# Patient Record
Sex: Male | Born: 1945 | Race: Black or African American | Hispanic: No | Marital: Single | Smoking: Former smoker
Health system: Southern US, Community
[De-identification: ages and names within clinical notes are randomized; demographics above are authoritative.]

## PROBLEM LIST (undated history)

## (undated) DIAGNOSIS — I10 Essential (primary) hypertension: Secondary | ICD-10-CM

## (undated) DIAGNOSIS — H409 Unspecified glaucoma: Secondary | ICD-10-CM

## (undated) DIAGNOSIS — K649 Unspecified hemorrhoids: Secondary | ICD-10-CM

## (undated) DIAGNOSIS — E7800 Pure hypercholesterolemia, unspecified: Secondary | ICD-10-CM

## (undated) DIAGNOSIS — E119 Type 2 diabetes mellitus without complications: Secondary | ICD-10-CM

## (undated) HISTORY — PX: PROSTATE SURGERY: SHX751

## (undated) HISTORY — PX: THROAT SURGERY: SHX803

---

## 2001-06-09 DIAGNOSIS — C801 Malignant (primary) neoplasm, unspecified: Secondary | ICD-10-CM

## 2001-06-09 HISTORY — DX: Malignant (primary) neoplasm, unspecified: C80.1

## 2006-08-13 ENCOUNTER — Ambulatory Visit: Payer: Self-pay | Admitting: Otolaryngology

## 2006-10-20 ENCOUNTER — Ambulatory Visit: Payer: Self-pay | Admitting: Unknown Physician Specialty

## 2006-10-20 HISTORY — PX: ESOPHAGOGASTRODUODENOSCOPY: SHX1529

## 2006-10-20 HISTORY — PX: COLONOSCOPY: SHX174

## 2009-03-21 ENCOUNTER — Ambulatory Visit: Payer: Self-pay

## 2010-12-29 ENCOUNTER — Emergency Department: Payer: Self-pay | Admitting: Unknown Physician Specialty

## 2012-12-03 ENCOUNTER — Emergency Department: Payer: Self-pay | Admitting: Emergency Medicine

## 2013-09-02 ENCOUNTER — Emergency Department: Payer: Self-pay | Admitting: Emergency Medicine

## 2014-02-19 ENCOUNTER — Emergency Department: Payer: Self-pay | Admitting: Emergency Medicine

## 2014-02-21 ENCOUNTER — Emergency Department: Payer: Self-pay | Admitting: Emergency Medicine

## 2014-02-21 LAB — CBC WITH DIFFERENTIAL/PLATELET
BASOS PCT: 0.5 %
Basophil #: 0 10*3/uL (ref 0.0–0.1)
EOS PCT: 1.7 %
Eosinophil #: 0.2 10*3/uL (ref 0.0–0.7)
HCT: 31.7 % — ABNORMAL LOW (ref 40.0–52.0)
HGB: 10.7 g/dL — AB (ref 13.0–18.0)
LYMPHS ABS: 1.1 10*3/uL (ref 1.0–3.6)
LYMPHS PCT: 12.1 %
MCH: 31.6 pg (ref 26.0–34.0)
MCHC: 33.6 g/dL (ref 32.0–36.0)
MCV: 94 fL (ref 80–100)
Monocyte #: 0.9 x10 3/mm (ref 0.2–1.0)
Monocyte %: 10.5 %
NEUTROS PCT: 75.2 %
Neutrophil #: 6.7 10*3/uL — ABNORMAL HIGH (ref 1.4–6.5)
PLATELETS: 222 10*3/uL (ref 150–440)
RBC: 3.38 10*6/uL — AB (ref 4.40–5.90)
RDW: 13.6 % (ref 11.5–14.5)
WBC: 9 10*3/uL (ref 3.8–10.6)

## 2014-02-21 LAB — COMPREHENSIVE METABOLIC PANEL
AST: 17 U/L (ref 15–37)
Albumin: 3.5 g/dL (ref 3.4–5.0)
Alkaline Phosphatase: 55 U/L
Anion Gap: 10 (ref 7–16)
BUN: 20 mg/dL — ABNORMAL HIGH (ref 7–18)
Bilirubin,Total: 0.3 mg/dL (ref 0.2–1.0)
CHLORIDE: 100 mmol/L (ref 98–107)
Calcium, Total: 10 mg/dL (ref 8.5–10.1)
Co2: 25 mmol/L (ref 21–32)
Creatinine: 1.88 mg/dL — ABNORMAL HIGH (ref 0.60–1.30)
EGFR (African American): 42 — ABNORMAL LOW
GFR CALC NON AF AMER: 36 — AB
GLUCOSE: 150 mg/dL — AB (ref 65–99)
OSMOLALITY: 276 (ref 275–301)
Potassium: 4.1 mmol/L (ref 3.5–5.1)
SGPT (ALT): 13 U/L — ABNORMAL LOW
SODIUM: 135 mmol/L — AB (ref 136–145)
TOTAL PROTEIN: 8.3 g/dL — AB (ref 6.4–8.2)

## 2014-02-26 LAB — CULTURE, BLOOD (SINGLE)

## 2014-10-19 ENCOUNTER — Emergency Department: Payer: Medicare Other

## 2014-10-19 ENCOUNTER — Other Ambulatory Visit: Payer: Self-pay

## 2014-10-19 ENCOUNTER — Emergency Department
Admission: EM | Admit: 2014-10-19 | Discharge: 2014-10-19 | Disposition: A | Discharge status: Home / Self Care | Payer: Medicare Other | Attending: Emergency Medicine | Admitting: Emergency Medicine

## 2014-10-19 ENCOUNTER — Encounter: Payer: Self-pay | Admitting: Medical Oncology

## 2014-10-19 DIAGNOSIS — E119 Type 2 diabetes mellitus without complications: Secondary | ICD-10-CM | POA: Diagnosis not present

## 2014-10-19 DIAGNOSIS — I10 Essential (primary) hypertension: Secondary | ICD-10-CM | POA: Insufficient documentation

## 2014-10-19 DIAGNOSIS — M7911 Myalgia of mastication muscle: Secondary | ICD-10-CM

## 2014-10-19 DIAGNOSIS — R0789 Other chest pain: Secondary | ICD-10-CM | POA: Diagnosis not present

## 2014-10-19 DIAGNOSIS — Z87891 Personal history of nicotine dependence: Secondary | ICD-10-CM | POA: Diagnosis not present

## 2014-10-19 DIAGNOSIS — R079 Chest pain, unspecified: Secondary | ICD-10-CM | POA: Diagnosis present

## 2014-10-19 HISTORY — DX: Essential (primary) hypertension: I10

## 2014-10-19 HISTORY — DX: Pure hypercholesterolemia, unspecified: E78.00

## 2014-10-19 HISTORY — DX: Type 2 diabetes mellitus without complications: E11.9

## 2014-10-19 MED ORDER — NAPROXEN 500 MG PO TABS: 500.0000 mg | ORAL_TABLET | Freq: Two times a day (BID) | ORAL | Status: AC

## 2014-10-19 NOTE — ED Notes (Signed)
Soreness to right nipple x2 weeks , no injury

## 2014-10-19 NOTE — ED Provider Notes (Signed)
[facility] ____________________________  Time seen: Approximately 6:14 PM  I have reviewed the triage vital signs and the nursing notes.   HISTORY  Chief Complaint Chest Pain    HPI Daniel Brady is a 69 y.o. male complained of pain to the right nipple and chest wall for 2 weeks. Patient states the pains only when he's palpate an area. Patient denies any dyspnea. Onset after starting increased physical activity with weight training. Patient rates pain 6/10 UPON PALPATION. PATIENT HAS DENIED ANY DISCHARGE FROM THE NIPPLE SWELLING AROUND THE NIPPLE AREA  Past Medical History  Diagnosis Date  . Diabetes mellitus without complication   . Hypertension   . High cholesterol     There are no active problems to display for this patient.   Past Surgical History  Procedure Laterality Date  . Prostate surgery      No current outpatient prescriptions on file.  Allergies Review of patient's allergies indicates no known allergies.  No family history on file.  Social History History  Substance Use Topics  . Smoking status: Former Research scientist (life sciences)  . Smokeless tobacco: Not on file  . Alcohol Use: No    Review of Systems Constitutional: No fever/chills Eyes: No visual changes. ENT: No sore throat. Cardiovascular: Denies chest pain. Respiratory: Denies shortness of breath. Gastrointestinal: No abdominal pain.  No nausea, no vomiting.  No diarrhea.  No constipation. Genitourinary: Negative for dysuria. Musculoskeletal: Negative for back pain. Mild right chest wall pain. Skin: Negative for rash. Neurological: Negative for headaches, focal weakness or numbness. 10-point ROS otherwise negative.  ____________________________________________   PHYSICAL EXAM:  VITAL SIGNS: ED Triage Vitals  Enc Vitals Group     BP 10/19/14 1735 142/71 mmHg     Pulse Rate 10/19/14 1735 63     Resp 10/19/14 1735 18     Temp 10/19/14 1735 97.4 F (36.3 C)     Temp Source  10/19/14 1735 Oral     SpO2 10/19/14 1735 97 %     Weight 10/19/14 1735 154 lb (69.854 kg)     Height 10/19/14 1735 6\' 1"  (1.854 m)     Head Cir --      Peak Flow --      Pain Score 10/19/14 1736 6     Pain Loc --      Pain Edu? --      Excl. in [place]? --    Constitutional: Alert and oriented. Well appearing and in no acute distress. Eyes: Conjunctivae are normal. PERRL. EOMI. Head: Atraumatic. Nose: No congestion/rhinnorhea. Mouth/Throat: Mucous membranes are moist.  Oropharynx non-erythematous. Neck: No stridor. Neck is supple Hematological/Lymphatic/Immunilogical: No cervical lymphadenopathy. Cardiovascular: Normal rate, regular rhythm. Grossly normal heart sounds.  Good peripheral circulation. Respiratory: Normal respiratory effort.  No retractions. Lungs CTAB. Gastrointestinal: Soft and nontender. No distention. No abdominal bruits. No CVA tenderness. Musculoskeletal: No lower extremity tenderness nor edema.  No joint effusions. Neurologic:  Normal speech and language. No gross focal neurologic deficits are appreciated. Speech is normal. No gait instability. Skin:  Skin is warm, dry and intact. No rash noted. Psychiatric: Mood and affect are normal. Speech and behavior are normal.  ____________________________________________   LABS (all labs ordered are listed, but only abnormal results are displayed)  Labs Reviewed - No data to display ____________________________________________  EKG  Normal sinus Rhythm ____________________________________________  RADIOLOGY   ____________________________________________   PROCEDURES  Procedure(s) performed: None  Critical Care performed: No  ____________________________________________   INITIAL IMPRESSION / ASSESSMENT AND  PLAN / ED COURSE  Pertinent labs & imaging results that were available during my care of the patient were reviewed by me and considered in my medical decision making (see chart for  details).  Myalgia ____________________________________________   FINAL CLINICAL IMPRESSION(S) / ED DIAGNOSES  Final diagnoses:  Masticatory myalgia      Sable Feil, PA-C 10/19/14 [place], PA-C 10/19/14 1831  Lisa Roca, MD 10/19/14 2342

## 2014-10-19 NOTE — Discharge Instructions (Signed)

## 2014-10-19 NOTE — ED Notes (Signed)
Pt ambulatory to triage with reports of rt nipple pain x 2 weeks, only painful to touch. Denies chest pain or sob. NAD noted.

## 2016-04-17 ENCOUNTER — Emergency Department
Admission: EM | Admit: 2016-04-17 | Discharge: 2016-04-17 | Disposition: A | Discharge status: Home / Self Care | Payer: Medicare Other | Attending: Student in an Organized Health Care Education/Training Program | Admitting: Student in an Organized Health Care Education/Training Program

## 2016-04-17 ENCOUNTER — Emergency Department: Payer: Medicare Other

## 2016-04-17 ENCOUNTER — Encounter: Payer: Self-pay | Admitting: Medical Oncology

## 2016-04-17 DIAGNOSIS — E119 Type 2 diabetes mellitus without complications: Secondary | ICD-10-CM | POA: Insufficient documentation

## 2016-04-17 DIAGNOSIS — R51 Headache: Secondary | ICD-10-CM | POA: Insufficient documentation

## 2016-04-17 DIAGNOSIS — Y92410 Unspecified street and highway as the place of occurrence of the external cause: Secondary | ICD-10-CM | POA: Insufficient documentation

## 2016-04-17 DIAGNOSIS — Y999 Unspecified external cause status: Secondary | ICD-10-CM | POA: Insufficient documentation

## 2016-04-17 DIAGNOSIS — Y9389 Activity, other specified: Secondary | ICD-10-CM | POA: Diagnosis not present

## 2016-04-17 DIAGNOSIS — Z87891 Personal history of nicotine dependence: Secondary | ICD-10-CM | POA: Insufficient documentation

## 2016-04-17 DIAGNOSIS — M542 Cervicalgia: Secondary | ICD-10-CM | POA: Diagnosis not present

## 2016-04-17 DIAGNOSIS — I10 Essential (primary) hypertension: Secondary | ICD-10-CM | POA: Insufficient documentation

## 2016-04-17 DIAGNOSIS — Z791 Long term (current) use of non-steroidal anti-inflammatories (NSAID): Secondary | ICD-10-CM | POA: Diagnosis not present

## 2016-04-17 DIAGNOSIS — S0990XA Unspecified injury of head, initial encounter: Secondary | ICD-10-CM | POA: Diagnosis present

## 2016-04-17 MED ORDER — CYCLOBENZAPRINE HCL 5 MG PO TABS: 5.0000 mg | ORAL_TABLET | Freq: Three times a day (TID) | ORAL | 0 refills | Status: AC | PRN
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
## 2019-10-13 ENCOUNTER — Other Ambulatory Visit
Admission: RE | Admit: 2019-10-13 | Discharge: 2019-10-13 | Disposition: A | Discharge status: Home / Self Care | Payer: Medicare Other | Source: Ambulatory Visit | Attending: Internal Medicine | Admitting: Internal Medicine

## 2019-10-13 ENCOUNTER — Other Ambulatory Visit: Payer: Self-pay

## 2019-10-13 DIAGNOSIS — Z20822 Contact with and (suspected) exposure to covid-19: Secondary | ICD-10-CM | POA: Diagnosis not present

## 2019-10-13 DIAGNOSIS — Z01812 Encounter for preprocedural laboratory examination: Secondary | ICD-10-CM | POA: Diagnosis present

## 2019-10-13 LAB — SARS CORONAVIRUS 2 (TAT 6-24 HRS): SARS Coronavirus 2: NEGATIVE

## 2019-10-14 ENCOUNTER — Encounter: Payer: Self-pay | Admitting: Internal Medicine

## 2019-10-17 ENCOUNTER — Encounter: Payer: Self-pay | Admitting: Internal Medicine

## 2019-10-17 ENCOUNTER — Ambulatory Visit: Payer: Medicare Other | Admitting: Anesthesiology

## 2019-10-17 ENCOUNTER — Other Ambulatory Visit: Payer: Self-pay

## 2019-10-17 ENCOUNTER — Encounter
Admission: RE | Disposition: A | Discharge status: Home / Self Care | Payer: Self-pay | Source: Home / Self Care | Attending: Internal Medicine

## 2019-10-17 ENCOUNTER — Ambulatory Visit
Admission: RE | Admit: 2019-10-17 | Discharge: 2019-10-17 | Disposition: A | Discharge status: Home / Self Care | Payer: Medicare Other | Attending: Internal Medicine | Admitting: Internal Medicine

## 2019-10-17 DIAGNOSIS — D123 Benign neoplasm of transverse colon: Secondary | ICD-10-CM | POA: Insufficient documentation

## 2019-10-17 DIAGNOSIS — Z1211 Encounter for screening for malignant neoplasm of colon: Secondary | ICD-10-CM | POA: Diagnosis present

## 2019-10-17 DIAGNOSIS — Z791 Long term (current) use of non-steroidal anti-inflammatories (NSAID): Secondary | ICD-10-CM | POA: Diagnosis not present

## 2019-10-17 DIAGNOSIS — E7800 Pure hypercholesterolemia, unspecified: Secondary | ICD-10-CM | POA: Insufficient documentation

## 2019-10-17 DIAGNOSIS — Z7984 Long term (current) use of oral hypoglycemic drugs: Secondary | ICD-10-CM | POA: Insufficient documentation

## 2019-10-17 DIAGNOSIS — H42 Glaucoma in diseases classified elsewhere: Secondary | ICD-10-CM | POA: Insufficient documentation

## 2019-10-17 DIAGNOSIS — I10 Essential (primary) hypertension: Secondary | ICD-10-CM | POA: Insufficient documentation

## 2019-10-17 DIAGNOSIS — Z79899 Other long term (current) drug therapy: Secondary | ICD-10-CM | POA: Insufficient documentation

## 2019-10-17 DIAGNOSIS — E1139 Type 2 diabetes mellitus with other diabetic ophthalmic complication: Secondary | ICD-10-CM | POA: Insufficient documentation

## 2019-10-17 DIAGNOSIS — Z8546 Personal history of malignant neoplasm of prostate: Secondary | ICD-10-CM | POA: Diagnosis not present

## 2019-10-17 DIAGNOSIS — K640 First degree hemorrhoids: Secondary | ICD-10-CM | POA: Diagnosis not present

## 2019-10-17 DIAGNOSIS — Z7982 Long term (current) use of aspirin: Secondary | ICD-10-CM | POA: Diagnosis not present

## 2019-10-17 HISTORY — DX: Unspecified hemorrhoids: K64.9

## 2019-10-17 HISTORY — DX: Unspecified glaucoma: H40.9

## 2019-10-17 HISTORY — PX: COLONOSCOPY WITH PROPOFOL: SHX5780

## 2019-10-17 SURGERY — COLONOSCOPY WITH PROPOFOL
Anesthesia: General

## 2019-10-17 MED ORDER — PROPOFOL 500 MG/50ML IV EMUL
INTRAVENOUS | Status: DC | PRN
  Administered 2019-10-17: 150 ug/kg/min via INTRAVENOUS

## 2019-10-17 MED ORDER — SODIUM CHLORIDE 0.9 % IV SOLN: INTRAVENOUS | Status: DC

## 2019-10-17 MED ORDER — PROPOFOL 10 MG/ML IV BOLUS
INTRAVENOUS | Status: DC | PRN
  Administered 2019-10-17: 70 mg via INTRAVENOUS

## 2019-10-17 MED ORDER — LIDOCAINE HCL (CARDIAC) PF 100 MG/5ML IV SOSY
PREFILLED_SYRINGE | INTRAVENOUS | Status: DC | PRN
  Administered 2019-10-17: 80 mg via INTRAVENOUS

## 2019-10-17 NOTE — Op Note (Signed)
[facility] Gastroenterology Patient Name: Daniel Brady Procedure Date: 10/17/2019 1:34 PM MRN: [id number] Account #: [id number] Date of Birth: [birth year] Admit Type: Outpatient Age: 74 Room: [facility] ENDO ROOM 3 Gender: Male Note Status: Finalized Procedure:             Colonoscopy Indications:           Screening for colorectal malignant neoplasm Providers:             Benay Pike. Alice Reichert MD, MD Referring MD:          No Local Md, MD (Referring MD) Medicines:             Propofol per Anesthesia Complications:         No immediate complications. Procedure:             Pre-Anesthesia Assessment:                        - The risks and benefits of the procedure and the                         sedation options and risks were discussed with the                         patient. All questions were answered and informed                         consent was obtained.                        - Patient identification and proposed procedure were                         verified prior to the procedure by the nurse. The                         procedure was verified in the procedure room.                        - After reviewing the risks and benefits, the patient                         was deemed in satisfactory condition to undergo the                         procedure.                        - ASA Grade Assessment: III - A patient with severe                         systemic disease.                        - After reviewing the risks and benefits, the patient                         was deemed in satisfactory condition to undergo the                         procedure.  After obtaining informed consent, the colonoscope was                         passed under direct vision. Throughout the procedure,                         the patient's blood pressure, pulse, and oxygen                         saturations were monitored continuously. The        Colonoscope was introduced through the anus and                         advanced to the the cecum, identified by appendiceal                         orifice and ileocecal valve. The colonoscopy was                         performed without difficulty. The patient tolerated                         the procedure well. The quality of the bowel                         preparation was good. The ileocecal valve, appendiceal                         orifice, and rectum were photographed. Findings:      The perianal and digital rectal examinations were normal. Pertinent       negatives include normal sphincter tone and no palpable rectal lesions.      Non-bleeding internal hemorrhoids were found during retroflexion. The       hemorrhoids were Grade I (internal hemorrhoids that do not prolapse).      A 4 mm polyp was found in the transverse colon. The polyp was sessile.       The polyp was removed with a jumbo cold forceps. Resection and retrieval       were complete.      The exam was otherwise without abnormality on direct and retroflexion       views. Impression:            - Non-bleeding internal hemorrhoids.                        - One 4 mm polyp in the transverse colon, removed with                         a jumbo cold forceps. Resected and retrieved.                        - The examination was otherwise normal on direct and                         retroflexion views. Recommendation:        - Patient has a contact number available for  emergencies. The signs and symptoms of potential                         delayed complications were discussed with the patient.                         Return to normal activities tomorrow. Written                         discharge instructions were provided to the patient.                        - Resume previous diet.                        - Continue present medications.                        - If polyps are benign or  adenomatous without                         dysplasia, I will advise NO further colonoscopy due to                         advanced age and/or severe comorbidity.                        - Return to my office PRN.                        - The findings and recommendations were discussed with                         the patient. Procedure Code(s):     --- Professional ---                        [phone], Colonoscopy, flexible; with biopsy, single or                         multiple Diagnosis Code(s):     --- Professional ---                        K64.0, First degree hemorrhoids                        K63.5, Polyp of colon                        Z12.11, Encounter for screening for malignant neoplasm                         of colon CPT copyright 2019 American Medical Association. All rights reserved. The codes documented in this report are preliminary and upon coder review may  be revised to meet current compliance requirements. Efrain Sella MD, MD 10/17/2019 1:56:43 PM This report has been signed electronically. Number of Addenda: 0 Note Initiated On: 10/17/2019 1:34 PM Scope Withdrawal Time: 0 hours 7 minutes 6 seconds  Total Procedure Duration: 0 hours 10 minutes 2 seconds  Estimated Blood Loss:  Estimated blood loss: none.  [facility]

## 2019-10-17 NOTE — Anesthesia Postprocedure Evaluation (Signed)
Anesthesia Post Note  Patient: Daniel Brady  Procedure(s) Performed: COLONOSCOPY WITH PROPOFOL (N/A )  Patient location during evaluation: Endoscopy Anesthesia Type: General Level of consciousness: awake and alert and oriented Pain management: pain level controlled Vital Signs Assessment: post-procedure vital signs reviewed and stable Respiratory status: spontaneous breathing, nonlabored ventilation and respiratory function stable Cardiovascular status: blood pressure returned to baseline and stable Postop Assessment: no signs of nausea or vomiting Anesthetic complications: no     Last Vitals:  Vitals:   10/17/19 1359 10/17/19 1419  BP: 105/63 109/60  Pulse:    Resp: 16   Temp: 36.7 C   SpO2:      Last Pain:  Vitals:   10/17/19 1419  TempSrc:   PainSc: 0-No pain                 Zuria Fosdick

## 2019-10-17 NOTE — Anesthesia Preprocedure Evaluation (Signed)
Anesthesia Evaluation  Patient identified by MRN, date of birth, ID band Patient awake    Reviewed: Allergy & Precautions, NPO status , Patient's Chart, lab work & pertinent test results  History of Anesthesia Complications Negative for: history of anesthetic complications  Airway Mallampati: II  TM Distance: >3 FB Neck ROM: Full    Dental  (+) Poor Dentition, Missing   Pulmonary neg sleep apnea, neg COPD, former smoker,    breath sounds clear to auscultation- rhonchi (-) wheezing      Cardiovascular hypertension, Pt. on medications (-) CAD, (-) Past MI, (-) Cardiac Stents and (-) CABG  Rhythm:Regular Rate:Normal - Systolic murmurs and - Diastolic murmurs    Neuro/Psych neg Seizures negative neurological ROS  negative psych ROS   GI/Hepatic negative GI ROS, Neg liver ROS,   Endo/Other  diabetes, Oral Hypoglycemic Agents  Renal/GU negative Renal ROS     Musculoskeletal negative musculoskeletal ROS (+)   Abdominal (+) - obese,   Peds  Hematology negative hematology ROS (+)   Anesthesia Other Findings Past Medical History: 2003: Cancer ([place])     Comment:  prostate No date: Diabetes mellitus without complication ([facility])     Comment:  type 2 No date: Glaucoma No date: Hemorrhoids No date: High cholesterol No date: Hypertension   Reproductive/Obstetrics                             Anesthesia Physical Anesthesia Plan  ASA: II  Anesthesia Plan: General   Post-op Pain Management:    Induction: Intravenous  PONV Risk Score and Plan: 1 and Propofol infusion  Airway Management Planned: Natural Airway  Additional Equipment:   Intra-op Plan:   Post-operative Plan:   Informed Consent: I have reviewed the patients History and Physical, chart, labs and discussed the procedure including the risks, benefits and alternatives for the proposed anesthesia with the patient or authorized  representative who has indicated his/her understanding and acceptance.     Dental advisory given  Plan Discussed with: CRNA and Anesthesiologist  Anesthesia Plan Comments:         Anesthesia Quick Evaluation

## 2019-10-17 NOTE — Transfer of Care (Signed)
Immediate Anesthesia Transfer of Care Note  Patient: Daniel Brady  Procedure(s) Performed: COLONOSCOPY WITH PROPOFOL (N/A )  Patient Location: Endoscopy Unit  Anesthesia Type:General  Level of Consciousness: drowsy  Airway & Oxygen Therapy: Patient Spontanous Breathing  Post-op Assessment: Report given to RN and Post -op Vital signs reviewed and stable  Post vital signs: Reviewed and stable  Last Vitals:  Vitals Value Taken Time  BP 105/63 10/17/19 1358  Temp    Pulse 82 10/17/19 1359  Resp 14 10/17/19 1359  SpO2 100 % 10/17/19 1359  Vitals shown include unvalidated device data.  Last Pain:  Vitals:   10/17/19 1240  TempSrc: Tympanic  PainSc: 0-No pain         Complications: No apparent anesthesia complications

## 2019-10-17 NOTE — H&P (Signed)
  Outpatient short stay form Pre-procedure 10/17/2019 12:23 PM Aking Klabunde K. Alice Reichert, M.D.  Primary Physician: Thereasa Distance, M.D.  Reason for visit:  Colon cancer screening.  History of present illness:  Patient presents for colonoscopy for colon cancer screening. The patient denies complaints of abdominal pain, significant change in bowel habits, or rectal bleeding.     No current facility-administered medications for this encounter.  Medications Prior to Admission  Medication Sig Dispense Refill Last Dose  . aspirin 81 MG chewable tablet Chew 81 mg by mouth daily.     . brimonidine (ALPHAGAN P) 0.1 % SOLN Place 1 drop into both eyes daily.     Marland Kitchen latanoprost (XALATAN) 0.005 % ophthalmic solution Place 1 drop into both eyes at bedtime.     . pioglitazone (ACTOS) 45 MG tablet Take 45 mg by mouth daily.     . simvastatin (ZOCOR) 20 MG tablet Take 20 mg by mouth daily.     . sitaGLIPtin (JANUVIA) 100 MG tablet Take 100 mg by mouth daily.     Marland Kitchen testosterone cypionate (DEPOTESTOSTERONE CYPIONATE) 200 MG/ML injection Inject 200 mg into the muscle every 7 (seven) days.     . Travoprost, BAK Free, (TRAVATAN) 0.004 % SOLN ophthalmic solution Place 1 drop into both eyes at bedtime.     . cyclobenzaprine (FLEXERIL) 5 MG tablet Take 1 tablet (5 mg total) by mouth 3 (three) times daily as needed for muscle spasms. 30 tablet 0   . naproxen (NAPROSYN) 500 MG tablet Take 1 tablet (500 mg total) by mouth 2 (two) times daily with a meal. 20 tablet 00      No Known Allergies   Past Medical History:  Diagnosis Date  . Cancer [facility]) 2003   prostate  . Diabetes mellitus without complication ([place])    type 2  . Glaucoma   . Hemorrhoids   . High cholesterol   . Hypertension     Review of systems:  Otherwise negative.    Physical Exam  Gen: Alert, oriented. Appears stated age.  HEENT: [place]/AT. PERRLA. Lungs: CTA, no wheezes. CV: RR nl S1, S2. Abd: soft, benign, no masses. BS+ Ext: No edema.  Pulses 2+    Planned procedures: Proceed with colonoscopy. The patient understands the nature of the planned procedure, indications, risks, alternatives and potential complications including but not limited to bleeding, infection, perforation, damage to internal organs and possible oversedation/side effects from anesthesia. The patient agrees and gives consent to proceed.  Please refer to procedure notes for findings, recommendations and patient disposition/instructions.     Benett Swoyer K. Alice Reichert, M.D. Gastroenterology 10/17/2019  12:23 PM

## 2019-10-17 NOTE — Interval H&P Note (Signed)
History and Physical Interval Note:  10/17/2019 12:25 PM  Daniel Brady  has presented today for surgery, with the diagnosis of [place].  The various methods of treatment have been discussed with the patient and family. After consideration of risks, benefits and other options for treatment, the patient has consented to  Procedure(s): COLONOSCOPY WITH PROPOFOL (N/A) as a surgical intervention.  The patient's history has been reviewed, patient examined, no change in status, stable for surgery.  I have reviewed the patient's chart and labs.  Questions were answered to the patient's satisfaction.     [place], [place]

## 2019-10-18 ENCOUNTER — Encounter: Payer: Self-pay | Admitting: *Deleted

## 2019-10-19 LAB — SURGICAL PATHOLOGY

## 2022-08-13 ENCOUNTER — Emergency Department
Admission: EM | Admit: 2022-08-13 | Discharge: 2022-08-13 | Disposition: A | Discharge status: Home / Self Care | Payer: Medicare Other | Attending: Student in an Organized Health Care Education/Training Program | Admitting: Student in an Organized Health Care Education/Training Program

## 2022-08-13 ENCOUNTER — Encounter: Payer: Self-pay | Admitting: Emergency Medicine

## 2022-08-13 DIAGNOSIS — R310 Gross hematuria: Secondary | ICD-10-CM | POA: Insufficient documentation

## 2022-08-13 DIAGNOSIS — R1030 Lower abdominal pain, unspecified: Secondary | ICD-10-CM | POA: Diagnosis not present

## 2022-08-13 DIAGNOSIS — R339 Retention of urine, unspecified: Secondary | ICD-10-CM | POA: Insufficient documentation

## 2022-08-13 DIAGNOSIS — Z8546 Personal history of malignant neoplasm of prostate: Secondary | ICD-10-CM | POA: Insufficient documentation

## 2022-08-13 DIAGNOSIS — R338 Other retention of urine: Secondary | ICD-10-CM

## 2022-08-13 LAB — BASIC METABOLIC PANEL
Anion gap: 7 (ref 5–15)
BUN: 26 mg/dL — ABNORMAL HIGH (ref 8–23)
CO2: 23 mmol/L (ref 22–32)
Calcium: 9.7 mg/dL (ref 8.9–10.3)
Chloride: 103 mmol/L (ref 98–111)
Creatinine, Ser: 1.7 mg/dL — ABNORMAL HIGH (ref 0.61–1.24)
GFR, Estimated: 41 mL/min — ABNORMAL LOW (ref >60)
Glucose, Bld: 157 mg/dL — ABNORMAL HIGH (ref 70–99)
Potassium: 4 mmol/L (ref 3.5–5.1)
Sodium: 133 mmol/L — ABNORMAL LOW (ref 135–145)

## 2022-08-13 LAB — CBC WITH DIFFERENTIAL/PLATELET
Abs Immature Granulocytes: 0.01 10*3/uL (ref 0.00–0.07)
Basophils Absolute: 0 10*3/uL (ref 0.0–0.1)
Basophils Relative: 1 %
Eosinophils Absolute: 0.1 10*3/uL (ref 0.0–0.5)
Eosinophils Relative: 1 %
HCT: 32.6 % — ABNORMAL LOW (ref 39.0–52.0)
Hemoglobin: 10.6 g/dL — ABNORMAL LOW (ref 13.0–17.0)
Immature Granulocytes: 0 %
Lymphocytes Relative: 13 %
Lymphs Abs: 0.8 10*3/uL (ref 0.7–4.0)
MCH: 30.5 pg (ref 26.0–34.0)
MCHC: 32.5 g/dL (ref 30.0–36.0)
MCV: 93.7 fL (ref 80.0–100.0)
Monocytes Absolute: 0.3 10*3/uL (ref 0.1–1.0)
Monocytes Relative: 5 %
Neutro Abs: 4.8 10*3/uL (ref 1.7–7.7)
Neutrophils Relative %: 80 %
Platelets: 190 10*3/uL (ref 150–400)
RBC: 3.48 MIL/uL — ABNORMAL LOW (ref 4.22–5.81)
RDW: 14.2 % (ref 11.5–15.5)
WBC: 5.9 10*3/uL (ref 4.0–10.5)
nRBC: 0 % (ref 0.0–0.2)

## 2022-08-13 LAB — URINALYSIS, ROUTINE W REFLEX MICROSCOPIC
Bacteria, UA: NONE SEEN
RBC / HPF: >50 RBC/hpf (ref 0–5)
Specific Gravity, Urine: 1.018 (ref 1.005–1.030)
Squamous Epithelial / HPF: NONE SEEN /HPF (ref 0–5)
WBC, UA: NONE SEEN WBC/hpf (ref 0–5)

## 2022-08-13 MED ORDER — LIDOCAINE HCL URETHRAL/MUCOSAL 2 % EX GEL
1.0000 | Freq: Once | CUTANEOUS | Status: AC
  Administered 2022-08-13: 1 via URETHRAL
  Filled 2022-08-13: qty 10

## 2022-08-13 MED ORDER — CEPHALEXIN 500 MG PO CAPS: 500.0000 mg | ORAL_CAPSULE | Freq: Two times a day (BID) | ORAL | 0 refills | Status: AC

## 2022-08-13 NOTE — ED Provider Notes (Signed)
[facility] Provider Note    Event Date/Time   First MD Initiated Contact with Patient 08/13/22 [phone]     (approximate)   History   No chief complaint on file.   HPI  Ayven Cahan is a 77 y.o. male history of prostate cancer presents to the ER for evaluation of urinary retention and severe suprapubic pain.  States that noted in the middle the night he got up with the urge to pee the past blood.  Yesterday was not having any urinary symptoms.  Not any blood thinners.     Physical Exam   Triage Vital Signs: ED Triage Vitals  Enc Vitals Group     BP 08/13/22 0652 (!) 199/111     Pulse Rate 08/13/22 0617 (!) 110     Resp 08/13/22 0617 (!) 21     Temp 08/13/22 0617 98.3 F (36.8 C)     Temp Source 08/13/22 0617 Oral     SpO2 08/13/22 0617 100 %     Weight 08/13/22 0618 143 lb (64.9 kg)     Height 08/13/22 0618 '6\' 1"'$  (1.854 m)     Head Circumference --      Peak Flow --      Pain Score 08/13/22 0619 8     Pain Loc --      Pain Edu? --      Excl. in [place]? --     Most recent vital signs: Vitals:   08/13/22 0652 08/13/22 0753  BP: (!) 199/111 (!) 172/95  Pulse:  88  Resp:    Temp:    SpO2:  100%     Constitutional: Alert uncomfortable appearing, standing beside bed Eyes: Conjunctivae are normal.  Head: Atraumatic. Nose: No congestion/rhinnorhea. Mouth/Throat: Mucous membranes are moist.   Neck: Painless ROM.  Cardiovascular:   Good peripheral circulation. Respiratory: Normal respiratory effort.  No retractions.  Gastrointestinal: Soft and nontender.  Musculoskeletal:  no deformity Neurologic:  MAE spontaneously. No gross focal neurologic deficits are appreciated.  Skin:  Skin is warm, dry and intact. No rash noted. Psychiatric: Mood and affect are normal. Speech and behavior are normal.    ED Results / Procedures / Treatments   Labs (all labs ordered are listed, but only abnormal results are displayed) Labs Reviewed  CBC WITH  DIFFERENTIAL/PLATELET - Abnormal; Notable for the following components:      Result Value   RBC 3.48 (*)    Hemoglobin 10.6 (*)    HCT 32.6 (*)    All other components within normal limits  BASIC METABOLIC PANEL - Abnormal; Notable for the following components:   Sodium 133 (*)    Glucose, Bld 157 (*)    BUN 26 (*)    Creatinine, Ser 1.70 (*)    GFR, Estimated 41 (*)    All other components within normal limits  URINALYSIS, ROUTINE W REFLEX MICROSCOPIC - Abnormal; Notable for the following components:   Color, Urine RED (*)    APPearance TURBID (*)    Glucose, UA   (*)    Value: TEST NOT REPORTED DUE TO COLOR INTERFERENCE OF URINE PIGMENT   Hgb urine dipstick   (*)    Value: TEST NOT REPORTED DUE TO COLOR INTERFERENCE OF URINE PIGMENT   Bilirubin Urine   (*)    Value: TEST NOT REPORTED DUE TO COLOR INTERFERENCE OF URINE PIGMENT   Ketones, ur   (*)    Value: TEST NOT REPORTED DUE TO COLOR INTERFERENCE  OF URINE PIGMENT   Protein, ur   (*)    Value: TEST NOT REPORTED DUE TO COLOR INTERFERENCE OF URINE PIGMENT   Nitrite   (*)    Value: TEST NOT REPORTED DUE TO COLOR INTERFERENCE OF URINE PIGMENT   Leukocytes,Ua   (*)    Value: TEST NOT REPORTED DUE TO COLOR INTERFERENCE OF URINE PIGMENT   All other components within normal limits     EKG     RADIOLOGY    PROCEDURES:  Critical Care performed:   Procedures   MEDICATIONS ORDERED IN ED: Medications  lidocaine (XYLOCAINE) 2 % jelly 1 Application (1 Application Urethral Given 08/13/22 0738)     IMPRESSION / MDM / ASSESSMENT AND PLAN / ED COURSE  I reviewed the triage vital signs and the nursing notes.                              Differential diagnosis includes, but is not limited to, urinary retention, bladder outlet obstruction, prostatitis, cystitis, mass  Patient presenting to the ER for evaluation of symptoms as described above.  Based on symptoms, risk factors and considered above differential, this presenting  complaint could reflect a potentially life-threatening illness therefore the patient will be placed on continuous pulse oximetry and telemetry for monitoring.  Laboratory evaluation will be sent to evaluate for the above complaints.  Will plan Foley catheter insertion for bladder decompression as he does have evidence of acute retention.   Clinical Course as of 08/13/22 0927  Wed Aug 13, 2022  0827 Patient with significant improvement after insertion of Foley catheter.  Initially [place] colored urine with manual irrigation was able to remove some clot now it is starting to clear will observe to make sure there is no recurrent retention. [PR]  0925 Bladder irrigated again.  Now with mostly clear red-tinged urine.  No residual clots after irrigation.  Will place on antibiotic due to concern for possible prostatitis.  Patient has follow-up with urology.  Discussed supportive care and signs and symptoms which she should return to the ER.  Patient agreeable to plan. [PR]    Clinical Course User Index [PR] Merlyn Lot, MD    FINAL CLINICAL IMPRESSION(S) / ED DIAGNOSES   Final diagnoses:  Gross hematuria  Acute urinary retention     Rx / DC Orders   ED Discharge Orders          Ordered    cephALEXin (KEFLEX) 500 MG capsule  2 times daily        08/13/22 [id number]             Note:  This document was prepared using Dragon voice recognition software and may include unintentional dictation errors.    Merlyn Lot, MD 08/13/22 [phone]

## 2022-08-13 NOTE — ED Triage Notes (Signed)
Pt with hx/p Prostate cancer as well as Prostate surgery in 2020. Pt to ED this AM due to hematuria as well as urinary retention since this AM.

## 2022-08-14 ENCOUNTER — Emergency Department
Admission: EM | Admit: 2022-08-14 | Discharge: 2022-08-14 | Disposition: A | Discharge status: Home / Self Care | Payer: Medicare Other | Attending: Emergency Medicine | Admitting: Emergency Medicine

## 2022-08-14 ENCOUNTER — Other Ambulatory Visit: Payer: Self-pay

## 2022-08-14 DIAGNOSIS — T839XXA Unspecified complication of genitourinary prosthetic device, implant and graft, initial encounter: Secondary | ICD-10-CM

## 2022-08-14 DIAGNOSIS — R339 Retention of urine, unspecified: Secondary | ICD-10-CM | POA: Diagnosis present

## 2022-08-14 NOTE — Discharge Instructions (Signed)
Follow-up with the urologist you were previously instructed to follow-up with.  Return emergency department if further problems with the catheter.

## 2022-08-14 NOTE — ED Notes (Signed)
Bladder scan volume 541 ml.

## 2022-08-14 NOTE — ED Triage Notes (Signed)
Pt to ED via POV from home. Pt had urinary catheter placed yesterday for urinary retention. Pt states catheter was working great but this morning realized there was no urine in drainage bag and it appears to be leaking around catheter tubing.Pt reports hx HTN and did not take BP meds this morning. Pt states he did drink water this morning and is not feeling "full" in his bladder.

## 2022-08-14 NOTE — ED Provider Notes (Signed)
[facility] Provider Note    Event Date/Time   First MD Initiated Contact with Patient 08/14/22 1423     (approximate)   History   Urinary Retention   HPI  Daniel Brady is a 77 y.o. male who was recently seen in the ED and had a Foley catheter placed presents emergency department stating that the catheter is leaking and his bladder is filling up.  Complaining of some lower abdominal pain.  No fever or chills.      Physical Exam   Triage Vital Signs: ED Triage Vitals  Enc Vitals Group     BP 08/14/22 1319 (!) 175/77     Pulse Rate 08/14/22 1319 92     Resp 08/14/22 1319 18     Temp 08/14/22 1319 98 F (36.7 C)     Temp Source 08/14/22 1319 Oral     SpO2 08/14/22 1319 100 %     Weight --      Height --      Head Circumference --      Peak Flow --      Pain Score 08/14/22 1320 5     Pain Loc --      Pain Edu? --      Excl. in [place]? --     Most recent vital signs: Vitals:   08/14/22 1319  BP: (!) 175/77  Pulse: 92  Resp: 18  Temp: 98 F (36.7 C)  SpO2: 100%     General: Awake, no distress.   CV:  Good peripheral perfusion. regular rate and  rhythm Resp:  Normal effort. Lungs cta Abd:  No distention.  Tender in the suprapubic area Other:      ED Results / Procedures / Treatments   Labs (all labs ordered are listed, but only abnormal results are displayed) Labs Reviewed - No data to display   EKG     RADIOLOGY Bladder scan    PROCEDURES:   Procedures   MEDICATIONS ORDERED IN ED: Medications - No data to display   IMPRESSION / MDM / [place] / ED COURSE  I reviewed the triage vital signs and the nursing notes.                              Differential diagnosis includes, but is not limited to, urinary retention, Foley catheter problem, UTI  Patient's presentation is most consistent with acute complicated illness / injury requiring diagnostic workup.   On examination of the Foley catheter  there is a tear distal to the penis, will have nursing staff replaced the Foley catheter  Bladder scan had shown 500 mL of urine, once the Foley catheter was exchanged he had coke colored urine in approximately 750 mL of urine and the catheter bag.  The patient is to follow-up with urology whom he already has an appointment with.  Return emergency department if worsening.  He is in agreement treatment plan.  He was discharged stable condition.      FINAL CLINICAL IMPRESSION(S) / ED DIAGNOSES   Final diagnoses:  Urinary retention  Problem with Foley catheter, initial encounter [facility])     Rx / DC Orders   ED Discharge Orders     None        Note:  This document was prepared using Dragon voice recognition software and may include unintentional dictation errors.    Versie Starks, PA-C 08/14/22 1626  Arta Silence, MD 08/14/22 2004

## 2022-08-16 ENCOUNTER — Emergency Department
Admission: EM | Admit: 2022-08-16 | Discharge: 2022-08-16 | Disposition: A | Discharge status: Home / Self Care | Payer: Medicare Other | Attending: Emergency Medicine | Admitting: Emergency Medicine

## 2022-08-16 ENCOUNTER — Other Ambulatory Visit: Payer: Self-pay

## 2022-08-16 DIAGNOSIS — T839XXA Unspecified complication of genitourinary prosthetic device, implant and graft, initial encounter: Secondary | ICD-10-CM

## 2022-08-16 DIAGNOSIS — T83091A Other mechanical complication of indwelling urethral catheter, initial encounter: Secondary | ICD-10-CM | POA: Insufficient documentation

## 2022-08-16 DIAGNOSIS — R339 Retention of urine, unspecified: Secondary | ICD-10-CM | POA: Insufficient documentation

## 2022-08-16 DIAGNOSIS — R338 Other retention of urine: Secondary | ICD-10-CM

## 2022-08-16 LAB — URINALYSIS, W/ REFLEX TO CULTURE (INFECTION SUSPECTED)
Bilirubin Urine: NEGATIVE
Glucose, UA: NEGATIVE mg/dL
Ketones, ur: NEGATIVE mg/dL
Nitrite: NEGATIVE
Protein, ur: 30 mg/dL — AB
Specific Gravity, Urine: 1.003 — ABNORMAL LOW (ref 1.005–1.030)
Squamous Epithelial / HPF: NONE SEEN /HPF (ref 0–5)
pH: 6 (ref 5.0–8.0)

## 2022-08-16 NOTE — ED Triage Notes (Signed)
Foley catheter placed on Wednesday.  Returned Thursday to have catheter replaced. Presents today c/o decreased urine output/drainage since yesterday at 1400.  States urine is coming out around catheter and not draining into bag.

## 2022-08-16 NOTE — Discharge Instructions (Addendum)
Call your urologist today to schedule a follow-up appointment and tell them that you have a Foley catheter for urinary retention that has clogged up several times in the last week requiring emergency department visits.  Continue taking your antibiotic as prescribed for the full 7-day course.  If you are unable to reach your regular urologist, call Dr. Glori Luis (our urologist) to schedule an appointment.  Thank you for choosing [place] for your health care today!  Please see your primary doctor this week for a follow up appointment.   Sometimes, in the early stages of certain disease courses it is difficult to detect in the emergency department evaluation -- so, it is important that you continue to monitor your symptoms and call your doctor right away or return to the emergency department if you develop any new or worsening symptoms.  Please go to the following website to schedule new (and existing) patient appointments:   [email or web address]  If you do not have a primary doctor try calling the following clinics to establish care:  If you have insurance:  [facility] [phone] [place] [address]   [facility]  [phone] [place] [place] [address]   If you do not have insurance:  [facility]  [phone] [address], [place] [address]   The following is another list of primary care offices in the area who are accepting new patients at this time.  Please reach out to one of them directly and let them know you would like to schedule an appointment to follow up on an Emergency Department visit, and/or to establish a new primary care provider (PCP).  There are likely other primary care clinics in the are who are accepting new patients, but this is an excellent place to start:  [place] physician: Dr Lavon Paganini [address] [place],  [place] [address] [phone]  [facility] Lead Physician: Dr Steele Sizer [address], [place], [place] [address] [phone]  [place] Physician: Dr Park Liter [address] [place], [place] [address] [phone]  [facility] Lead Physician: Dr Dewaine Oats [place], [place], [place] [address] [phone]  [facility] Physician: Dr Halina Maidens [address] [place], [place] [address] [phone]   It was my pleasure to care for you today.   Hoover Brunette Jacelyn Grip, MD

## 2022-08-16 NOTE — ED Provider Notes (Signed)
[facility] Provider Note    Event Date/Time   First MD Initiated Contact with Patient 08/16/22 [phone]     (approximate)   History   No chief complaint on file.   HPI  Daniel Brady is a 77 y.o. male   Past medical history of prostate cancer and acute urinary retention requiring Foley catheter earlier this week with a revisit to the emergency department with clogged Foley catheter and needing replacement who presents the emergency department with clogged Foley catheter and suprapubic discomfort.  Was flowing well until mid afternoon yesterday when it slowed down, no reported bleeding or clots from the patient yesterday.  Awoke this morning with no urine output in the bag and suprapubic discomfort.  Denies fevers chills pain elsewhere no other acute medical complaints.   External Medical Documents Reviewed: Emergency department visit dated 08/13/2022 and 08/14/2022 for Foley catheter placement for acute urinary retention and then replacement of Foley.  He was started on Keflex at the first visit.  He has been compliant.      Physical Exam   Triage Vital Signs: ED Triage Vitals  Enc Vitals Group     BP 08/16/22 0831 (!) 152/73     Pulse Rate 08/16/22 0831 80     Resp 08/16/22 0831 18     Temp 08/16/22 0831 97.7 F (36.5 C)     Temp Source 08/16/22 0831 Oral     SpO2 08/16/22 0831 100 %     Weight 08/16/22 0835 142 lb 13.7 oz (64.8 kg)     Height 08/16/22 0835 '6\' 1"'$  (1.854 m)     Head Circumference --      Peak Flow --      Pain Score 08/16/22 0834 10     Pain Loc --      Pain Edu? --      Excl. in [place]? --     Most recent vital signs: Vitals:   08/16/22 0831  BP: (!) 152/73  Pulse: 80  Resp: 18  Temp: 97.7 F (36.5 C)  SpO2: 100%    General: Awake, no distress.  CV:  Good peripheral perfusion.  Resp:  Normal effort.  Abd:  No distention.  Other:  Awake alert comfortable nontoxic hypertensive 150/70 otherwise vital signs within normal  limits.  Abdomen soft and nontender though he does state some suprapubic fullness sensation.  The Foley catheter is in place there is no urine in the bag, his penis appears normal   ED Results / Procedures / Treatments   Labs (all labs ordered are listed, but only abnormal results are displayed) Labs Reviewed  URINE CULTURE  URINALYSIS, W/ REFLEX TO CULTURE (INFECTION SUSPECTED)     PROCEDURES:  Critical Care performed: No  Procedures   MEDICATIONS ORDERED IN ED: Medications - No data to display   IMPRESSION / MDM / [place] / ED COURSE  I reviewed the triage vital signs and the nursing notes.                                Patient's presentation is most consistent with acute presentation with potential threat to life or bodily function.  Differential diagnosis includes, but is not limited to, acute urinary retention, urinary tract infection stated but less likely traumatic injury to the bladder, rupture, obstructive uropathy   The patient is on the cardiac monitor to evaluate for evidence of arrhythmia and/or  significant heart rate changes.  MDM: Comfortable patient with less than 24 hours of urinary retention some suprapubic fullness sensation with Foley intact in place will change over for three-way Foley catheter and assess for patency, need for irrigation, continue with antibiotics, send off a new urine for culture.  New Foley catheter placed with draining clear very light pink urine he feels much better.  Continues to drain.  Ultrasound shows no obvious large clots a decompressed bladder around a Foley catheter balloon.  I irrigated the bladder with no blood clots and clear fluid return.  Has establish care with a urologist in [place] that he saw just prior to the emergency department earlier this week for Foley placement.  They are unaware that he has a Foley catheter in place and his next follow-up is scheduled for next month.  I advised him that after  today's visit he must call his urologist to inform them of the Foley catheter placement this week and need for expedited follow-up.  I considered hospitalization for admission or observation however given the patient's symptomatic relief with Foley catheter placement approximately 500 cc of clear/pink urine draining and continuing to drain, already on antibiotics, otherwise vital signs reassuring and he has proper urology follow-up I think that outpatient monitoring and follow-up most appropriate at this time though he knows to return to the emergency department if he has any new or worsening symptoms and if his Foley clogged again.        FINAL CLINICAL IMPRESSION(S) / ED DIAGNOSES   Final diagnoses:  Acute urinary retention  Foley catheter problem, initial encounter [facility])     Rx / DC Orders   ED Discharge Orders     None        Note:  This document was prepared using Dragon voice recognition software and may include unintentional dictation errors.    Lucillie Garfinkel, MD 08/16/22 1001

## 2022-08-17 LAB — URINE CULTURE: Culture: NO GROWTH

## 2022-08-26 ENCOUNTER — Other Ambulatory Visit: Payer: Self-pay

## 2022-08-26 ENCOUNTER — Encounter: Payer: Self-pay | Admitting: Emergency Medicine

## 2022-08-26 ENCOUNTER — Emergency Department
Admission: EM | Admit: 2022-08-26 | Discharge: 2022-08-26 | Disposition: A | Discharge status: Home / Self Care | Payer: Medicare Other | Attending: Emergency Medicine | Admitting: Emergency Medicine

## 2022-08-26 DIAGNOSIS — N3001 Acute cystitis with hematuria: Secondary | ICD-10-CM

## 2022-08-26 DIAGNOSIS — N342 Other urethritis: Secondary | ICD-10-CM | POA: Diagnosis not present

## 2022-08-26 DIAGNOSIS — Z978 Presence of other specified devices: Secondary | ICD-10-CM

## 2022-08-26 DIAGNOSIS — Z960 Presence of urogenital implants: Secondary | ICD-10-CM | POA: Insufficient documentation

## 2022-08-26 DIAGNOSIS — N4889 Other specified disorders of penis: Secondary | ICD-10-CM | POA: Diagnosis present

## 2022-08-26 LAB — URINALYSIS, ROUTINE W REFLEX MICROSCOPIC
Bilirubin Urine: NEGATIVE
Glucose, UA: NEGATIVE mg/dL
Ketones, ur: NEGATIVE mg/dL
Nitrite: NEGATIVE
Protein, ur: 30 mg/dL — AB
Specific Gravity, Urine: 1.004 — ABNORMAL LOW (ref 1.005–1.030)
Squamous Epithelial / HPF: NONE SEEN /HPF (ref 0–5)
WBC, UA: >50 WBC/hpf (ref 0–5)
pH: 5 (ref 5.0–8.0)

## 2022-08-26 MED ORDER — STERILE WATER FOR INJECTION IJ SOLN
INTRAMUSCULAR | Status: AC
  Administered 2022-08-26: 2.1 mL
  Filled 2022-08-26: qty 10

## 2022-08-26 MED ORDER — CEFTRIAXONE SODIUM 1 G IJ SOLR
1.0000 g | Freq: Once | INTRAMUSCULAR | Status: AC
  Administered 2022-08-26: 1 g via INTRAMUSCULAR
  Filled 2022-08-26: qty 10

## 2022-08-26 MED ORDER — LIDOCAINE HCL URETHRAL/MUCOSAL 2 % EX GEL
1.0000 | Freq: Once | CUTANEOUS | Status: AC
  Administered 2022-08-26: 1 via URETHRAL
  Filled 2022-08-26: qty 6

## 2022-08-26 MED ORDER — CIPROFLOXACIN HCL 500 MG PO TABS: 500.0000 mg | ORAL_TABLET | Freq: Two times a day (BID) | ORAL | 0 refills | Status: AC

## 2022-08-26 MED ORDER — LIDOCAINE HCL 2 % EX GEL: 1.0000 | CUTANEOUS | 0 refills | Status: AC | PRN

## 2022-08-26 NOTE — ED Triage Notes (Signed)
Patient to ED for urinary catheter pain. Patient states it is draining without problems but hurts on outside penis when moving. Denies accidentally pulling it.

## 2022-08-26 NOTE — ED Provider Notes (Signed)
[facility] Provider Note  Patient Contact: 8:25 PM (approximate)   History   Catheter Pain   HPI  Daniel Brady is a 77 y.o. male who presents the emergency department complaining of pain at the head of the penis.  Patient has an indwelling Foley catheter, states that he gets a sharp pain with movement of the tissue around the catheter.  He has noticed a little bit of blood around the tube of the catheter again at the head of the penis.  He denies any bladder pain, flank pain, abdominal pain.  Patient states that he is having good urinary output with no hematuria.  Patient apparently was on antibiotics for a week, he was on Keflex.  He has had no symptoms such as pelvic pain, flank pain, fevers, chills.     Physical Exam   Triage Vital Signs: ED Triage Vitals [08/26/22 1758]  Enc Vitals Group     BP (!) 165/86     Pulse Rate 83     Resp 18     Temp 98.4 F (36.9 C)     Temp Source Oral     SpO2 98 %     Weight      Height      Head Circumference      Peak Flow      Pain Score 10     Pain Loc      Pain Edu?      Excl. in [place]?     Most recent vital signs: Vitals:   08/26/22 1758  BP: (!) 165/86  Pulse: 83  Resp: 18  Temp: 98.4 F (36.9 C)  SpO2: 98%     General: Alert and in no acute distress.   Cardiovascular:  Good peripheral perfusion Respiratory: Normal respiratory effort without tachypnea or retractions. Lungs CTAB.  Gastrointestinal: Bowel sounds 4 quadrants. Soft and nontender to palpation. No guarding or rigidity. No palpable masses. No distention. No CVA tenderness. Musculoskeletal: Full range of motion to all extremities.  Neurologic:  No gross focal neurologic deficits are appreciated.  Skin:   No rash noted Other:   ED Results / Procedures / Treatments   Labs (all labs ordered are listed, but only abnormal results are displayed) Labs Reviewed  URINALYSIS, ROUTINE W REFLEX MICROSCOPIC - Abnormal; Notable for the  following components:      Result Value   Color, Urine YELLOW (*)    APPearance CLOUDY (*)    Specific Gravity, Urine 1.004 (*)    Hgb urine dipstick LARGE (*)    Protein, ur 30 (*)    Leukocytes,Ua LARGE (*)    Bacteria, UA RARE (*)    All other components within normal limits  URINE CULTURE     EKG     RADIOLOGY    No results found.  PROCEDURES:  Critical Care performed: No  Procedures   MEDICATIONS ORDERED IN ED: Medications  sterile water (preservative free) injection (has no administration in time range)  cefTRIAXone (ROCEPHIN) injection 1 g (1 g Intramuscular Given 08/26/22 2247)  lidocaine (XYLOCAINE) 2 % jelly 1 Application (1 Application Urethral Given 08/26/22 2245)     IMPRESSION / MDM / ASSESSMENT AND PLAN / ED COURSE  I reviewed the triage vital signs and the nursing notes.                                 Differential diagnosis includes,  but is not limited to, UTI, urethritis, Foley catheter displacement   Patient's presentation is most consistent with acute presentation with potential threat to life or bodily function.   Patient's diagnosis is consistent with penile pain, indwelling catheter, UTI.  Patient presents emergency department complaining of penile pain.  He states that the pain is around the head of the penis where it rubs against his catheter.  He has no pelvic pain, flank pain.  Patient has had the indwelling catheter for roughly 10 days.  He has been treated for UTI with antibiotics which she states was Keflex.  Today's urinalysis reveals leukocytes and bacteria.  There is no significant change from previous urinalysis so my concern is that patient has not been adequately treated.  He has no concerning signs and symptoms.  I will give the patient a shot of Rocephin here, Uro-Jet of lidocaine for urethritis pain.  Will discharge the patient with antibiotics and lidocaine.  Follow-up with his urologist.  Return precautions discussed with the  patient..  Patient is given ED precautions to return to the ED for any worsening or new symptoms.     FINAL CLINICAL IMPRESSION(S) / ED DIAGNOSES   Final diagnoses:  Urethritis  Acute cystitis with hematuria  Indwelling Foley catheter present     Rx / DC Orders   ED Discharge Orders          Ordered    ciprofloxacin (CIPRO) 500 MG tablet  2 times daily        08/26/22 2248    lidocaine (XYLOCAINE) 2 % jelly  As needed        08/26/22 2248             Note:  This document was prepared using Dragon voice recognition software and may include unintentional dictation errors.   Brynda Peon 08/26/22 2248    Delman Kitten, MD 08/26/22 [phone]

## 2022-08-29 LAB — URINE CULTURE: Culture: >=100000 — AB

## 2022-08-31 NOTE — Consult Note (Signed)
ED Antimicrobial Stewardship Positive Culture Follow Up   Colan Hyppolite is an 77 y.o. male who presented to [facility] on 08/26/2022 with a chief complaint of  Chief Complaint  Patient presents with   Catheter Pain    Recent Results (from the past 720 hour(s))  Urine Culture (for pregnant, neutropenic or urologic patients or patients with an indwelling urinary catheter)     Status: None   Collection Time: 08/16/22 10:11 AM   Specimen: Urine, Clean Catch  Result Value Ref Range Status   Specimen Description   Final    URINE, CLEAN CATCH Performed at [facility], [address] [place], [place] [address]    Special Requests   Final    NONE Performed at [facility], [address] [place], [place] [address]    Culture   Final    NO GROWTH Performed at [facility], [place] [address], [place], [place] [address]    Report Status 08/17/2022 FINAL  Final  Urine Culture     Status: Abnormal   Collection Time: 08/26/22  8:27 PM   Specimen: Urine, Random  Result Value Ref Range Status   Specimen Description   Final    URINE, RANDOM Performed at [facility], [address] [place], [place] [address]    Special Requests   Final    NONE Performed at [facility], [address] [place], [place] [address]    Culture (A)  Final    >=100,000 COLONIES/mL PSEUDOMONAS AERUGINOSA >=100,000 COLONIES/mL ENTEROCOCCUS FAECALIS    Report Status 08/29/2022 FINAL  Final   Organism ID, Bacteria PSEUDOMONAS AERUGINOSA (A)  Final   Organism ID, Bacteria ENTEROCOCCUS FAECALIS (A)  Final      Susceptibility   Enterococcus faecalis - MIC*    AMPICILLIN <=2 SENSITIVE Sensitive     NITROFURANTOIN <=16 SENSITIVE Sensitive     VANCOMYCIN 1 SENSITIVE Sensitive     * >=100,000 COLONIES/mL ENTEROCOCCUS FAECALIS   Pseudomonas aeruginosa - MIC*    CEFTAZIDIME 4 SENSITIVE Sensitive     CIPROFLOXACIN <=0.25 SENSITIVE Sensitive     GENTAMICIN <=1 SENSITIVE  Sensitive     IMIPENEM 1 SENSITIVE Sensitive     PIP/TAZO 16 SENSITIVE Sensitive     CEFEPIME 2 SENSITIVE Sensitive     * >=100,000 COLONIES/mL PSEUDOMONAS AERUGINOSA    [x]  Treated with ciprofloxacin, organism resistant to prescribed antimicrobial []  Patient discharged originally without antimicrobial agent and treatment is now indicated  Patient discharged on ciprofloxacin which covers Pseudomonas but spoke with patient who is still experiencing dysuria. Will see urology on 4/3. Adding amoxicillin for E. Faecalis.  New antibiotic prescription: Amoxicillin 500 mg PO q8H x7 days  ED Provider: Rada Hay, MD   Will M. Ouida Sills, PharmD PGY-1 Pharmacy Resident 08/31/2022 12:07 PM

## 2022-09-10 ENCOUNTER — Ambulatory Visit (INDEPENDENT_AMBULATORY_CARE_PROVIDER_SITE_OTHER): Payer: Medicare Other | Admitting: Urology

## 2022-09-10 ENCOUNTER — Encounter: Payer: Self-pay | Admitting: Urology

## 2022-09-10 VITALS — BP 133/74 | HR 76 | Ht 73.0 in | Wt 143.8 lb

## 2022-09-10 VITALS — BP 132/75 | HR 85 | Ht 73.0 in | Wt 139.0 lb

## 2022-09-10 DIAGNOSIS — R339 Retention of urine, unspecified: Secondary | ICD-10-CM

## 2022-09-10 DIAGNOSIS — R310 Gross hematuria: Secondary | ICD-10-CM | POA: Diagnosis not present

## 2022-09-10 LAB — BLADDER SCAN AMB NON-IMAGING

## 2022-09-10 NOTE — Progress Notes (Signed)
Catheter Removal  Patient is present today for a catheter removal.  60ml of water was drained from the balloon. A 18FR foley cath was removed from the bladder, no complications were noted. Patient tolerated well.  Performed by: Gaspar Cola Cma   Follow up/ Additional notes: Afternoon appt.

## 2022-09-10 NOTE — Progress Notes (Signed)
Patient returns afternoon for follow-up bladder scan.  Bladder scan notes residual of 166 mL.  He states he is voiding well.  With his first initial void after Foley catheter was removed he passed a clot but it has been a very light pink-tinged since that time.  Return precautions reviewed.  He will contact his urologist today or tomorrow for follow-up instructions

## 2022-09-10 NOTE — Progress Notes (Signed)
I, Daniel Brady,acting as a scribe for Daniel Sons, MD.,have documented all relevant documentation on the behalf of Daniel Sons, MD,as directed by  Daniel Sons, MD while in the presence of Daniel Sons, MD.   09/10/22 9:23 AM   [name] 29-Jun-1945 [id number]  Referring provider: [name], MD [place],  [place] [address]  Chief Complaint  Patient presents with   Urinary Retention    HPI: Daniel Brady is a 77 y.o. male who is referred for an evaluation of urinary retention.  ED visit 08/13/22 with gross hematuria and urinary retention. A foley catheter was placed and he was started on Keflex. Urine culture was subsequently negative.  He returned to the ED on 08/14/22 complaining of leakage around the catheter and was found to have a clogged catheter. His foley catheter was exchanged at that time.  He returned to ED on 08/16/22 complaining of recurrent discomfort and no drainage noted in the bag. His catheter was again exchanged.  His last ED visit was on 08/26/22 complaining of penile pain. A urine culture was ordered during that visit which grew  pseudomonas and enterococcus. He received rocephin and was treated with ciprofloxacin. History of prostate cancer treated with brachytherapy in 2003 at [facility] He is followed by Dr. [name] who saw him two days before his most recent episode of retention.   PMH: Past Medical History:  Diagnosis Date   Cancer 2003   prostate   Diabetes mellitus without complication    type 2   Glaucoma    Hemorrhoids    High cholesterol    Hypertension     Surgical History: Past Surgical History:  Procedure Laterality Date   COLONOSCOPY  10/20/2006   hemorrhoids   COLONOSCOPY WITH PROPOFOL N/A 10/17/2019   Procedure: COLONOSCOPY WITH PROPOFOL;  Surgeon: Toledo, Benay Pike, MD;  Location: [facility] ENDOSCOPY;  Service: Gastroenterology;  Laterality: N/A;   ESOPHAGOGASTRODUODENOSCOPY  10/20/2006    PROSTATE SURGERY     THROAT SURGERY      Home Medications:  Allergies as of 09/10/2022   No Known Allergies      Medication List        Accurate as of September 10, 2022  9:23 AM. If you have any questions, ask your nurse or doctor.          aspirin 81 MG chewable tablet Chew 81 mg by mouth daily.   brimonidine 0.1 % Soln Commonly known as: ALPHAGAN P Place 1 drop into both eyes daily.   cyclobenzaprine 5 MG tablet Commonly known as: FLEXERIL Take 1 tablet (5 mg total) by mouth 3 (three) times daily as needed for muscle spasms.   latanoprost 0.005 % ophthalmic solution Commonly known as: XALATAN Place 1 drop into both eyes at bedtime.   lidocaine 2 % jelly Commonly known as: XYLOCAINE Place 1 Application into the urethra as needed.   naproxen 500 MG tablet Commonly known as: Naprosyn Take 1 tablet (500 mg total) by mouth 2 (two) times daily with a meal.   pioglitazone 45 MG tablet Commonly known as: ACTOS Take 45 mg by mouth daily.   simvastatin 20 MG tablet Commonly known as: ZOCOR Take 20 mg by mouth daily.   sitaGLIPtin 100 MG tablet Commonly known as: JANUVIA Take 100 mg by mouth daily.   testosterone cypionate 200 MG/ML injection Commonly known as: DEPOTESTOSTERONE CYPIONATE Inject 200 mg into the muscle every 7 (seven) days.  Travoprost (BAK Free) 0.004 % Soln ophthalmic solution Commonly known as: TRAVATAN Place 1 drop into both eyes at bedtime.        Allergies: No Known Allergies  Social History:  reports that he has quit smoking. He has never used smokeless tobacco. He reports that he does not drink alcohol and does not use drugs.   Physical Exam: BP 132/75   Pulse 85   Ht 6\' 1"  (1.854 m)   Wt 139 lb (63 kg)   BMI 18.34 kg/m   Constitutional:  Alert and oriented, No acute distress. HEENT: [place] AT Respiratory: Normal respiratory effort, no increased work of breathing. GU: foley catheter in place Psychiatric: Normal mood and  affect.   Assessment & Plan:    Urinary retention Appears to have been precipitated by an episode of gross hematuria. Catheter was removed today for a voiding trial and he has follow up this afternoon for a bladder scan.  2. Gross hematuria Resolved He desires to continue follow-up at [facility] in [facility] and will message Dr. Ronalee Red.  I have reviewed the above documentation for accuracy and completeness, and I agree with the above.   Daniel Brady, [place] [address], [place], [place] [address] [phone]
